# Patient Record
Sex: Female | Born: 1995 | Race: White | Hispanic: No | Marital: Single | Smoking: Never smoker
Health system: Southern US, Community
[De-identification: ages and names within clinical notes are randomized; demographics above are authoritative.]

## PROBLEM LIST (undated history)

## (undated) DIAGNOSIS — E282 Polycystic ovarian syndrome: Secondary | ICD-10-CM

## (undated) DIAGNOSIS — R091 Pleurisy: Secondary | ICD-10-CM

## (undated) HISTORY — PX: TONSILLECTOMY AND ADENOIDECTOMY: SHX28

## (undated) HISTORY — DX: Pleurisy: R09.1

## (undated) HISTORY — DX: Polycystic ovarian syndrome: E28.2

---

## 2016-06-05 ENCOUNTER — Ambulatory Visit (INDEPENDENT_AMBULATORY_CARE_PROVIDER_SITE_OTHER): Payer: Managed Care, Other (non HMO) | Admitting: Family Medicine

## 2016-06-05 ENCOUNTER — Encounter: Payer: Self-pay | Admitting: Family Medicine

## 2016-06-05 VITALS — BP 106/56 | HR 89 | Temp 98.1°F | Ht 64.0 in | Wt 148.8 lb

## 2016-06-05 DIAGNOSIS — R21 Rash and other nonspecific skin eruption: Secondary | ICD-10-CM

## 2016-06-05 DIAGNOSIS — L83 Acanthosis nigricans: Secondary | ICD-10-CM

## 2016-06-05 MED ORDER — HYDROCORTISONE 2.5 % EX CREA: TOPICAL_CREAM | CUTANEOUS | 1 refills | Status: DC

## 2016-06-05 NOTE — Patient Instructions (Signed)
Call in 2-3 weeks if no improvement. Can discuss taking a piece of the skin and sending it to the lab vs sending you to a skin specialist.

## 2016-06-05 NOTE — Progress Notes (Signed)
Pre visit review using our clinic review tool, if applicable. No additional management support is needed unless otherwise documented below in the visit note. 

## 2016-06-05 NOTE — Progress Notes (Signed)
Chief Complaint  Patient presents with  . Establish Care    pt states she has discoloration behind (B) knee-noticed x 2 weeks,pt was tx'ed last week for UTI-finshed ATB on yest want to follow-up on this       New Patient Visit SUBJECTIVE: HPI: Christie Soto is an 21 y.o.female who is being seen for establishing care.  The patient has had darkening behind both of her knees over the past 2 weeks. She does have a history of PCOS and is concerned it may be related to a acanthosis nigricans. She did go to an urgent care for abdominal pain, and was treated for urinary tract infection. A fasting blood sugar was 95. The area is not spreading. It itches, no pain or drainage. No new soaps, lotions, topicals, or detergents. No fevers or sick contacts.  No Known Allergies  Past Medical History:  Diagnosis Date  . PCOS (polycystic ovarian syndrome)   . Pleurisy    Past Surgical History:  Procedure Laterality Date  . TONSILLECTOMY AND ADENOIDECTOMY     Social History   Social History  . Marital status: Single   Social History Main Topics  . Smoking status: Never Smoker  . Smokeless tobacco: Never Used  . Alcohol use No  . Drug use: No   Family History  Problem Relation Age of Onset  . Hyperlipidemia Mother   . Hypertension Mother   . Cancer Mother     Cervical  . Cancer Maternal Grandfather     Brien Mates   Takes no medications routinely.  No LMP recorded. Patient is not currently having periods (Reason: PCOS).  ROS Const: Denies fevers  Skin: As noted in HPI   OBJECTIVE: BP (!) 106/56 (BP Location: Left Arm, Patient Position: Sitting, Cuff Size: Normal)   Pulse 89   Temp 98.1 F (36.7 C) (Oral)   Ht  (1.626 m)   Wt 148 lb 12.8 oz (67.5 kg)   SpO2 97%   BMI 25.54 kg/m   Constitutional: -  VS reviewed -  Well developed, well nourished, appears stated age -  No apparent distress  Psychiatric: -  Oriented to person, place, and time -  Memory intact -  Affect and  mood normal -  Fluent conversation, good eye contact -  Judgment and insight age appropriate  Eye: -  Conjunctivae clear, no discharge -  Pupils symmetric, round, reactive to light  ENMT: -  Oral mucosa without lesions, tongue and uvula midline    Tonsils not enlarged, no erythema, no exudate, trachea midline    Pharynx moist, no lesions, no erythema  Neck: -  No gross swelling, no palpable masses -  Thyroid midline, not enlarged, mobile, no palpable masses  Cardiovascular: -  RRR, no murmurs -  No LE edema  Respiratory: -  Normal respiratory effort, no accessory muscle use, no retraction -  Breath sounds equal, no wheezes, no ronchi, no crackles  Neurological:  -  CN II - XII grossly intact -  Sensation grossly intact to light touch, equal bilaterally  Musculoskeletal: -  No clubbing, no cyanosis -  Gait normal  Skin: -  Circumferential region of hyperpigmentation around the neck. No scaling, erythema, fluctuance, or tenderness to palpation. -  In the popliteal region bilaterally, there are black colored lesions that is not tender to palpation and without fluctuance, erythema; it is uniform in color, on the right side, there are areas of excoriation    ASSESSMENT/PLAN: Skin rash - Plan: hydrocortisone  2.5 % cream  Acanthosis nigricans - Plan: Hemoglobin A1c  Patient instructed to sign release of records form from her previous PCP. Steroid cream for pruritis. Will check A1c. If WNL and area persists, will let us know and we will either biopsy it vs refer to derm. Patient should return pending above. The patient voiced understanding and agreement to the plan.   Jilda Roche [place], DO 06/05/16  1:10 PM

## 2017-06-10 ENCOUNTER — Encounter: Payer: Managed Care, Other (non HMO) | Admitting: Family Medicine

## 2017-11-06 ENCOUNTER — Ambulatory Visit: Payer: Managed Care, Other (non HMO) | Admitting: Family Medicine

## 2017-11-06 ENCOUNTER — Encounter: Payer: Self-pay | Admitting: Family Medicine

## 2017-11-06 VITALS — BP 110/70 | HR 79 | Temp 98.2°F | Ht 64.0 in | Wt 171.1 lb

## 2017-11-06 DIAGNOSIS — R599 Enlarged lymph nodes, unspecified: Secondary | ICD-10-CM

## 2017-11-06 DIAGNOSIS — Z114 Encounter for screening for human immunodeficiency virus [HIV]: Secondary | ICD-10-CM

## 2017-11-06 DIAGNOSIS — Z0000 Encounter for general adult medical examination without abnormal findings: Secondary | ICD-10-CM

## 2017-11-06 NOTE — Patient Instructions (Addendum)
2-3 days to get results of labs results back.  If labs are normal, will get an ultrasound.  Let us know if you need anything.

## 2017-11-06 NOTE — Progress Notes (Signed)
Pre visit review using our clinic review tool, if applicable. No additional management support is needed unless otherwise documented below in the visit note. 

## 2017-11-06 NOTE — Progress Notes (Signed)
Chief Complaint  Patient presents with  . Adenopathy    Subjective: Patient is a 22 y.o. female here for swollen gland on L.  3 mo duration, went to UC and given abx. Pain improved, never got smaller though. No inj or illness. No URI s/s's. Gets bigger when she eats. No B cell s/s's. No lymph node enlargement in groin, neck or   ROS: Const: no fevers  Past Medical History:  Diagnosis Date  . PCOS (polycystic ovarian syndrome)   . Pleurisy     Objective: BP 110/70 (BP Location: Left Arm, Patient Position: Sitting, Cuff Size: Normal)   Pulse 79   Temp 98.2 F (36.8 C) (Oral)   Ht 5\' 4"  (1.626 m)   Wt 171 lb 2 oz (77.6 kg)   SpO2 99%   BMI 29.37 kg/m  General: Awake, appears stated age HEENT: MMM, EOMi, ears neg, nose neg Neck: Slightly enlarged submand gland on L , no ttp/erythema/warmth, fluctuance Heart: RRR Lungs: CTAB, no rales, wheezes or rhonchi. No accessory muscle use Psych: Age appropriate judgment and insight, normal affect and mood  Assessment and Plan: Enlarged glands  Well adult exam - Plan: CBC w/Diff, Lipid panel, Comprehensive metabolic panel  Screening for HIV (human immunodeficiency virus) - Plan: HIV Antibody (routine testing w rflx)  Orders as above.  Ck labs. If neg, will obtain us. Could be stone? CPE at earliest convenience. The patient voiced understanding and agreement to the plan.  Jilda Rocheicholas [place], DO 11/06/17  4:06 PM

## 2017-11-07 LAB — CBC WITH DIFFERENTIAL/PLATELET
BASOS: 1 %
Basophils Absolute: 0.1 10*3/uL (ref 0.0–0.2)
EOS (ABSOLUTE): 0.1 10*3/uL (ref 0.0–0.4)
EOS: 2 %
HEMATOCRIT: 38.5 % (ref 34.0–46.6)
HEMOGLOBIN: 13.2 g/dL (ref 11.1–15.9)
Immature Grans (Abs): 0 10*3/uL (ref 0.0–0.1)
Immature Granulocytes: 0 %
LYMPHS ABS: 1.6 10*3/uL (ref 0.7–3.1)
Lymphs: 20 %
MCH: 31.4 pg (ref 26.6–33.0)
MCHC: 34.3 g/dL (ref 31.5–35.7)
MCV: 91 fL (ref 79–97)
MONOCYTES: 7 %
Monocytes Absolute: 0.6 10*3/uL (ref 0.1–0.9)
NEUTROS ABS: 5.8 10*3/uL (ref 1.4–7.0)
Neutrophils: 70 %
Platelets: 376 10*3/uL (ref 150–450)
RBC: 4.21 x10E6/uL (ref 3.77–5.28)
RDW: 12 % — ABNORMAL LOW (ref 12.3–15.4)
WBC: 8.3 10*3/uL (ref 3.4–10.8)

## 2017-11-07 LAB — LIPID PANEL
Chol/HDL Ratio: 3 ratio (ref 0.0–4.4)
Cholesterol, Total: 140 mg/dL (ref 100–199)
HDL: 47 mg/dL (ref >39)
LDL CALC: 73 mg/dL (ref 0–99)
TRIGLYCERIDES: 98 mg/dL (ref 0–149)
VLDL CHOLESTEROL CAL: 20 mg/dL (ref 5–40)

## 2017-11-07 LAB — COMPREHENSIVE METABOLIC PANEL
A/G RATIO: 2.1 (ref 1.2–2.2)
ALK PHOS: 53 IU/L (ref 39–117)
ALT: 11 IU/L (ref 0–32)
AST: 16 IU/L (ref 0–40)
Albumin: 4.8 g/dL (ref 3.5–5.5)
BUN / CREAT RATIO: 15 (ref 9–23)
BUN: 12 mg/dL (ref 6–20)
Bilirubin Total: 0.5 mg/dL (ref 0.0–1.2)
CO2: 24 mmol/L (ref 20–29)
Calcium: 9.7 mg/dL (ref 8.7–10.2)
Chloride: 100 mmol/L (ref 96–106)
Creatinine, Ser: 0.81 mg/dL (ref 0.57–1.00)
GFR calc non Af Amer: 105 mL/min/{1.73_m2} (ref >59)
GFR, EST AFRICAN AMERICAN: 121 mL/min/{1.73_m2} (ref >59)
GLOBULIN, TOTAL: 2.3 g/dL (ref 1.5–4.5)
Glucose: 83 mg/dL (ref 65–99)
Potassium: 4.3 mmol/L (ref 3.5–5.2)
Sodium: 141 mmol/L (ref 134–144)
Total Protein: 7.1 g/dL (ref 6.0–8.5)

## 2017-11-07 LAB — HIV ANTIBODY (ROUTINE TESTING W REFLEX): HIV Screen 4th Generation wRfx: NONREACTIVE

## 2017-11-09 ENCOUNTER — Other Ambulatory Visit: Payer: Self-pay | Admitting: Family Medicine

## 2017-11-09 DIAGNOSIS — R599 Enlarged lymph nodes, unspecified: Secondary | ICD-10-CM

## 2017-11-13 ENCOUNTER — Ambulatory Visit (HOSPITAL_BASED_OUTPATIENT_CLINIC_OR_DEPARTMENT_OTHER): Payer: Managed Care, Other (non HMO)

## 2017-11-13 ENCOUNTER — Ambulatory Visit (INDEPENDENT_AMBULATORY_CARE_PROVIDER_SITE_OTHER): Payer: Managed Care, Other (non HMO)

## 2017-11-13 DIAGNOSIS — R590 Localized enlarged lymph nodes: Secondary | ICD-10-CM

## 2017-11-13 DIAGNOSIS — R599 Enlarged lymph nodes, unspecified: Secondary | ICD-10-CM

## 2017-11-17 ENCOUNTER — Telehealth: Payer: Self-pay

## 2017-11-17 DIAGNOSIS — R599 Enlarged lymph nodes, unspecified: Secondary | ICD-10-CM

## 2017-11-17 NOTE — Addendum Note (Signed)
Addended byConrad Amazonia: Lura Falor D on: 11/17/2017 05:06 PM   Modules accepted: Orders

## 2017-11-17 NOTE — Telephone Encounter (Signed)
Referral placed.

## 2017-11-17 NOTE — Telephone Encounter (Signed)
Copied from CRM [phone]#[id number]. Topic: Referral - Request >> Nov 17, 2017  1:19 PM Terisa Starraylor, Brittany L wrote: Reason for CRM: Patient said she received a mychart message for her to be referred to a ENT. She would like to move forward with that. She would like a call back. [phone]

## 2017-11-17 NOTE — Telephone Encounter (Signed)
OK to place referral. TY.  

## 2017-12-09 ENCOUNTER — Encounter: Payer: Managed Care, Other (non HMO) | Admitting: Family Medicine

## 2017-12-24 ENCOUNTER — Encounter: Payer: Self-pay | Admitting: Family Medicine

## 2018-12-17 ENCOUNTER — Other Ambulatory Visit: Payer: Self-pay

## 2018-12-17 ENCOUNTER — Emergency Department
Admission: EM | Admit: 2018-12-17 | Discharge: 2018-12-17 | Disposition: A | Discharge status: Home / Self Care | Payer: Managed Care, Other (non HMO) | Source: Home / Self Care | Attending: Family Medicine | Admitting: Family Medicine

## 2018-12-17 DIAGNOSIS — N3090 Cystitis, unspecified without hematuria: Secondary | ICD-10-CM

## 2018-12-17 DIAGNOSIS — R300 Dysuria: Secondary | ICD-10-CM

## 2018-12-17 LAB — POCT URINALYSIS DIP (MANUAL ENTRY)
Bilirubin, UA: NEGATIVE
Glucose, UA: NEGATIVE mg/dL
Ketones, POC UA: NEGATIVE mg/dL
Nitrite, UA: POSITIVE — AB
Protein Ur, POC: NEGATIVE mg/dL
Spec Grav, UA: 1.025 (ref 1.010–1.025)
Urobilinogen, UA: 0.2 E.U./dL
pH, UA: 6.5 (ref 5.0–8.0)

## 2018-12-17 MED ORDER — NITROFURANTOIN MONOHYD MACRO 100 MG PO CAPS: 100.0000 mg | ORAL_CAPSULE | Freq: Two times a day (BID) | ORAL | 0 refills | Status: DC

## 2018-12-17 NOTE — Discharge Instructions (Signed)
Increase fluid intake. May use non-prescription AZO for about two days, if desired, to decrease urinary discomfort.  If symptoms become significantly worse during the night or over the weekend, proceed to the local emergency room.  

## 2018-12-17 NOTE — ED Provider Notes (Signed)
[facility]    CSN: [id number] Arrival date & time: 12/17/18  1103      History   Chief Complaint Chief Complaint  Patient presents with  . Dysuria    HPI Christie Soto is a 23 y.o. female.   Patient awoke two days ago with dysuria and urgency that has persisted.  She denies abdominal/pelvic pain and fevers, chills, and sweats.  Patient's last menstrual period was 10/30/2018 (exact date).   The history is provided by the patient.  Dysuria Pain quality:  Burning Pain severity:  Mild Onset quality:  Sudden Duration:  2 days Timing:  Constant Progression:  Worsening Chronicity:  New Relieved by:  Phenazopyridine Worsened by:  Nothing Ineffective treatments:  None tried Urinary symptoms: frequent urination and hesitancy   Urinary symptoms: no discolored urine, no foul-smelling urine, no hematuria and no bladder incontinence   Associated symptoms: no abdominal pain, no fever, no flank pain, no genital lesions, no nausea and no vomiting   Risk factors: no recurrent urinary tract infections     Past Medical History:  Diagnosis Date  . PCOS (polycystic ovarian syndrome)   . Pleurisy     There are no active problems to display for this patient.   Past Surgical History:  Procedure Laterality Date  . TONSILLECTOMY AND ADENOIDECTOMY      OB History   No obstetric history on file.      Home Medications    Prior to Admission medications   Medication Sig Start Date End Date Taking? Authorizing Provider  drospirenone-ethinyl estradiol (YASMIN) 3-0.03 MG tablet Take by mouth. 08/15/16  Yes [provider]  nitrofurantoin, macrocrystal-monohydrate, (MACROBID) 100 MG capsule Take 1 capsule (100 mg total) by mouth 2 (two) times daily. Take with food. 12/17/18   Lattie Haw, MD    Family History Family History  Problem Relation Age of Onset  . Hyperlipidemia Mother   . Hypertension Mother   . Cancer Mother        Cervical  . Cancer  Maternal Grandfather        Lekemia    Social History Social History   Tobacco Use  . Smoking status: Never Smoker  . Smokeless tobacco: Never Used  Substance Use Topics  . Alcohol use: No  . Drug use: No     Allergies   Patient has no known allergies.   Review of Systems Review of Systems  Constitutional: Negative for fever.  Gastrointestinal: Negative for abdominal pain, nausea and vomiting.  Genitourinary: Positive for dysuria and urgency. Negative for flank pain, hematuria and pelvic pain.  All other systems reviewed and are negative.    Physical Exam Triage Vital Signs ED Triage Vitals  Enc Vitals Group     BP 12/17/18 1116 (!) 150/88     Pulse Rate 12/17/18 1116 76     Resp 12/17/18 1116 18     Temp 12/17/18 1116 98.6 F (37 C)     Temp Source 12/17/18 1116 Oral     SpO2 12/17/18 1116 100 %     Weight 12/17/18 1117 165 lb (74.8 kg)     Height 12/17/18 1117 5\' 4"  (1.626 m)     Head Circumference --      Peak Flow --      Pain Score 12/17/18 1117 2     Pain Loc --      Pain Edu? --      Excl. in GC? --    No data found.  Updated Vital Signs BP (!) 150/88 (BP Location: Right Arm)   Pulse 76   Temp 98.6 F (37 C) (Oral)   Resp 18   Ht 5\' 4"  (1.626 m)   Wt 74.8 kg   LMP 10/30/2018 (Exact Date)   SpO2 100%   BMI 28.32 kg/m   Visual Acuity Right Eye Distance:   Left Eye Distance:   Bilateral Distance:    Right Eye Near:   Left Eye Near:    Bilateral Near:     Physical Exam Nursing notes and Vital Signs reviewed. Appearance:  Patient appears stated age, and in no acute distress.    Eyes:  Pupils are equal, round, and reactive to light and accomodation.  Extraocular movement is intact.  Conjunctivae are not inflamed   Pharynx:  Normal; moist mucous membranes  Neck:  Supple.  No adenopathy Lungs:  Clear to auscultation.  Breath sounds are equal.  Moving air well. Heart:  Regular rate and rhythm without murmurs, rubs, or gallops.  Abdomen:   Nontender without masses or hepatosplenomegaly.  Bowel sounds are present.  No CVA or flank tenderness.  Extremities:  No edema.  Skin:  No rash present.     UC Treatments / Results  Labs (all labs ordered are listed, but only abnormal results are displayed) Labs Reviewed  POCT URINALYSIS DIP (MANUAL ENTRY) - Abnormal; Notable for the following components:      Result Value   Clarity, UA cloudy (*)    Blood, UA moderate (*)    Nitrite, UA Positive (*)    Leukocytes, UA Moderate (2+) (*)    All other components within normal limits  URINE CULTURE    EKG   Radiology No results found.  Procedures Procedures (including critical care time)  Medications Ordered in UC Medications - No data to display  Initial Impression / Assessment and Plan / UC Course  I have reviewed the triage vital signs and the nursing notes.  Pertinent labs & imaging results that were available during my care of the patient were reviewed by me and considered in my medical decision making (see chart for details).    Urine culture pending. Begin Macrobid 100mg  BID for one week.  .  Followup with Family Doctor if not improved in one week.    Final Clinical Impressions(s) / UC Diagnoses   Final diagnoses:  Dysuria  Cystitis     Discharge Instructions     Increase fluid intake. May use non-prescription AZO for about two days, if desired, to decrease urinary discomfort.  If symptoms become significantly worse during the night or over the weekend, proceed to the local emergency room.     ED Prescriptions    Medication Sig Dispense Auth. Provider   nitrofurantoin, macrocrystal-monohydrate, (MACROBID) 100 MG capsule Take 1 capsule (100 mg total) by mouth 2 (two) times daily. Take with food. 14 capsule Kandra Nicolas, MD        Kandra Nicolas, MD 12/17/18 [phone]

## 2018-12-17 NOTE — ED Triage Notes (Signed)
Pt c/o dysuria x 2 days. Taking azo prn. Last dose was yesterday morning.

## 2018-12-19 LAB — URINE CULTURE
MICRO NUMBER:: 1023733
SPECIMEN QUALITY:: ADEQUATE

## 2018-12-20 ENCOUNTER — Telehealth (HOSPITAL_COMMUNITY): Payer: Self-pay | Admitting: Emergency Medicine

## 2018-12-20 NOTE — Telephone Encounter (Signed)
Urine culture was positive for Escherichia coli and was given macrobid  at urgent care visit. Attempted to reach patient. No answer at this time.   

## 2018-12-30 ENCOUNTER — Other Ambulatory Visit: Payer: Self-pay

## 2018-12-30 DIAGNOSIS — Z20822 Contact with and (suspected) exposure to covid-19: Secondary | ICD-10-CM

## 2018-12-31 LAB — NOVEL CORONAVIRUS, NAA: SARS-CoV-2, NAA: NOT DETECTED

## 2019-03-01 ENCOUNTER — Ambulatory Visit: Payer: Managed Care, Other (non HMO) | Admitting: Family Medicine

## 2019-03-13 IMAGING — US US SOFT TISSUE HEAD/NECK
1 series · 13 of 13 positions shown · non-contrast
Comparison: None.

CLINICAL DATA: Enlarged lymph nodes, neck swelling

EXAM:
ULTRASOUND OF HEAD/NECK SOFT TISSUES
TECHNIQUE: Ultrasound examination of the head and neck soft tissues was
performed in the area of clinical concern.

[Series 1: us soft tissue head/neck · 0.04mm/px · 13 of 13 slices shown]
[im 1/13]
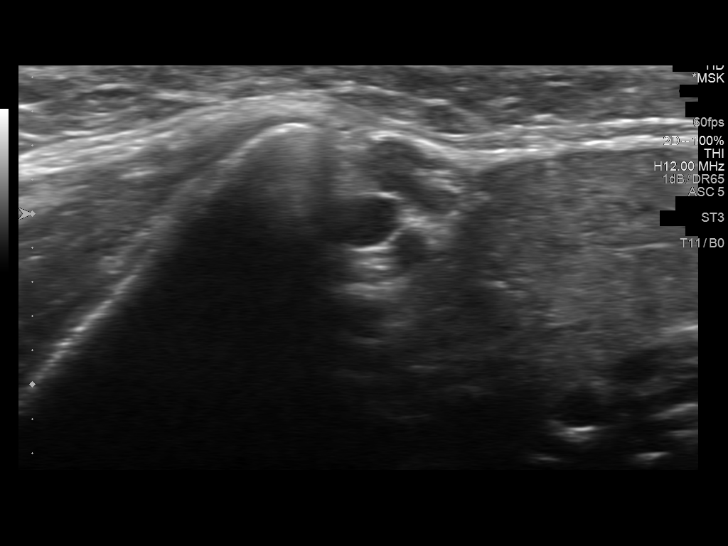
[im 2/13]
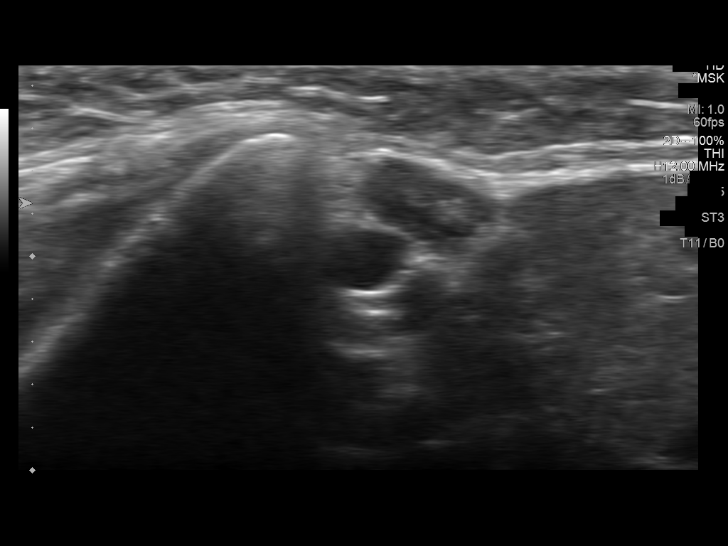
[im 3/13]
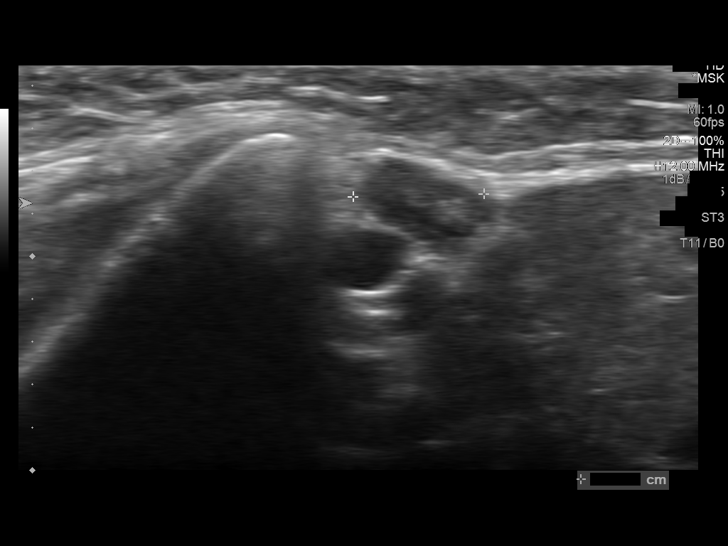
[im 4/13]
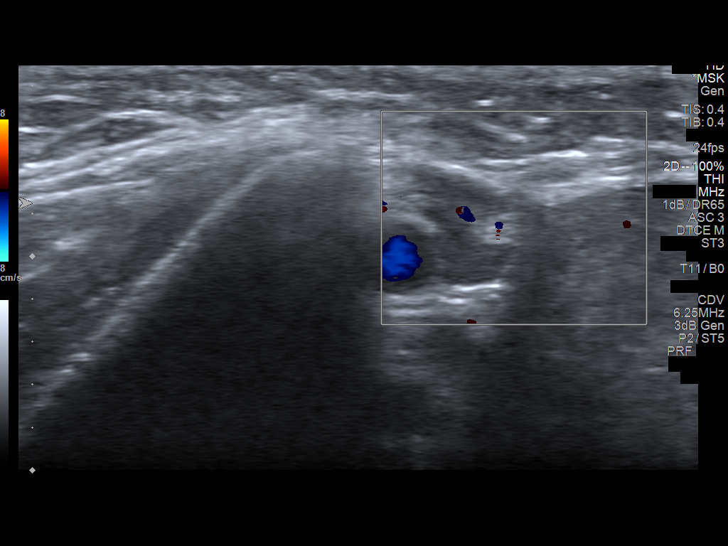
[im 5/13]
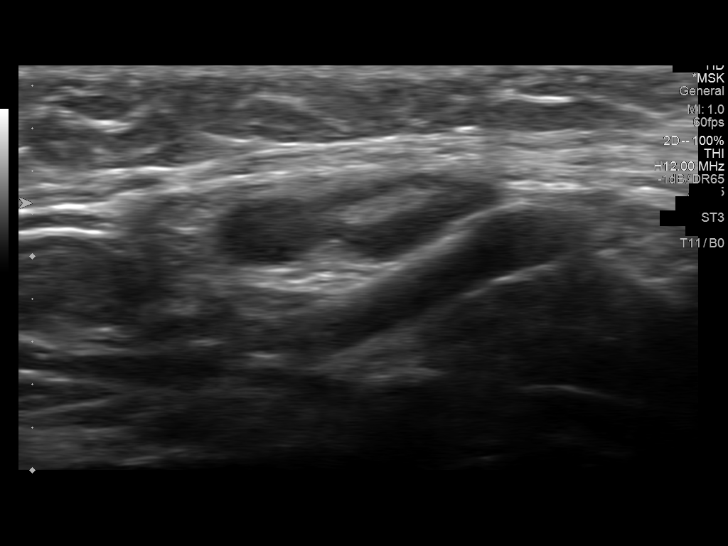
[im 6/13]
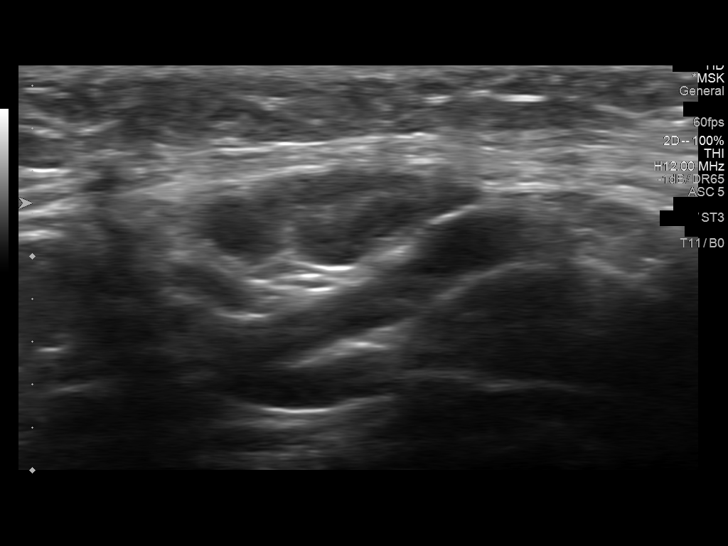
[im 7/13]
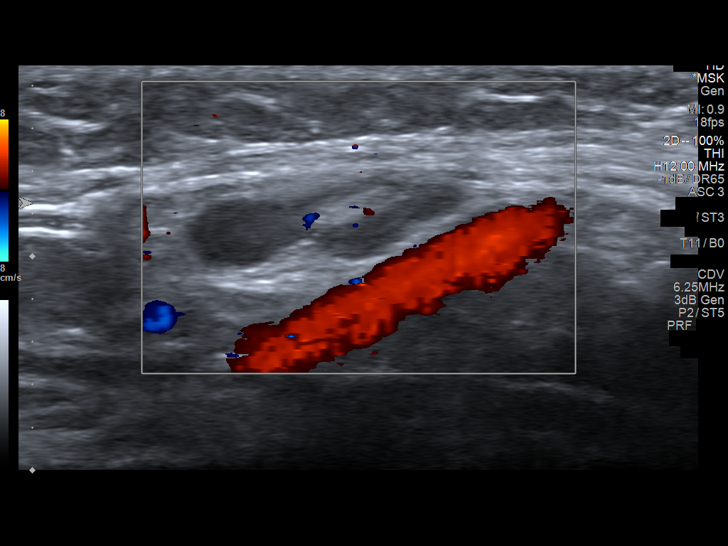
[im 8/13]
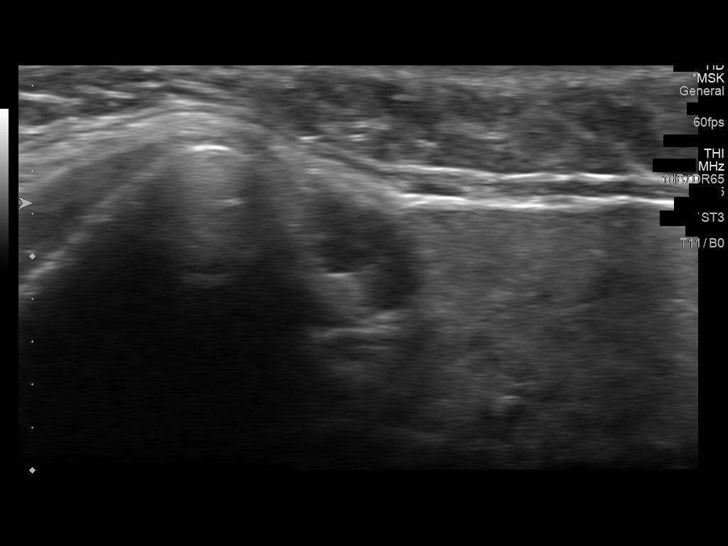
[im 9/13]
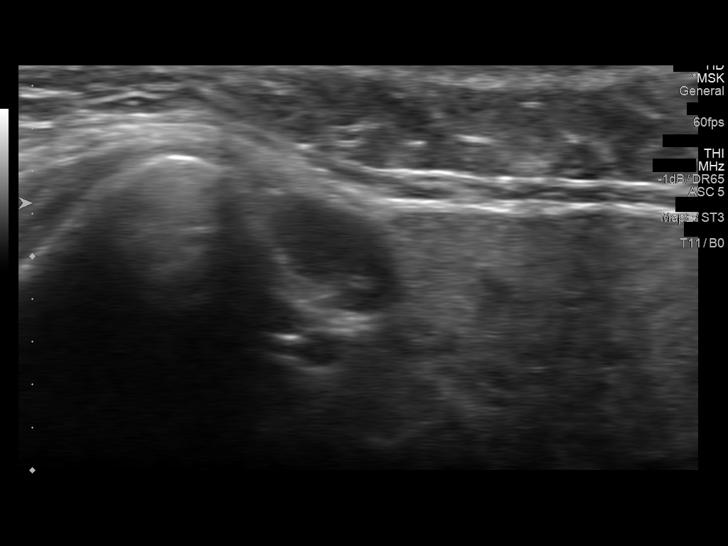
[im 10/13]
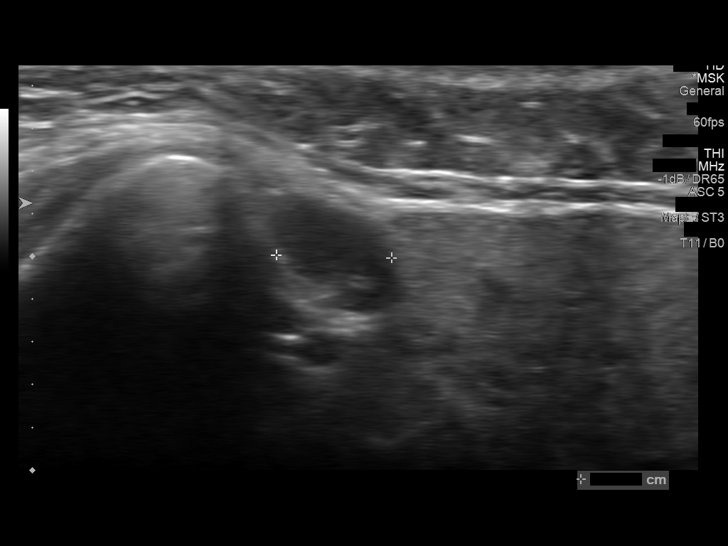
[im 11/13]
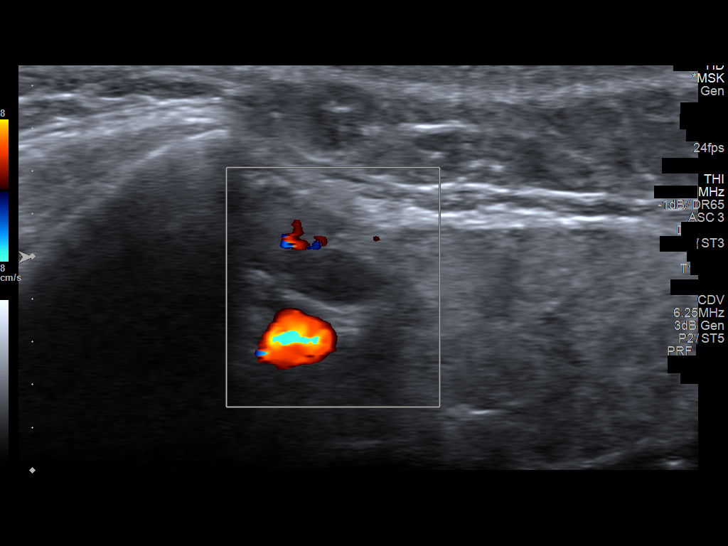
[im 12/13]
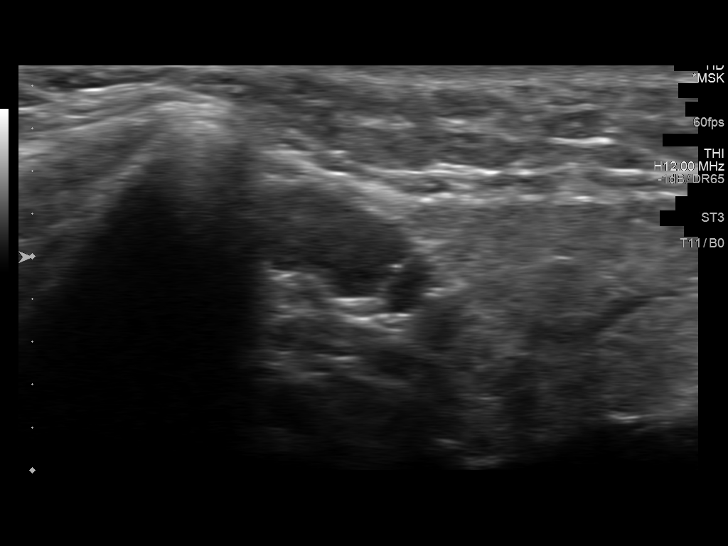
[im 13/13]
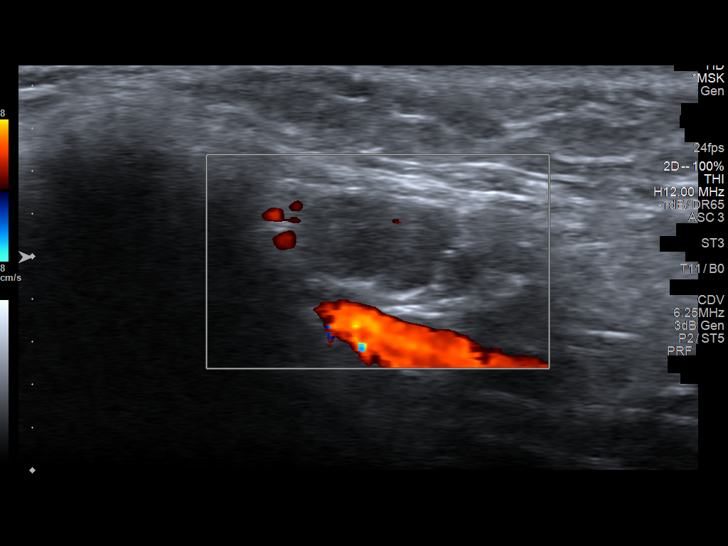

[13 of 13 positions shown; findings below may reference images not displayed]

FINDINGS: Limited superficial soft tissue ultrasound performed of the
submandibular areas of concern. Comparison performed by imaging the
right side as well.

On the left side, the area of concern correlates with a small
superficial submandibular lymph nodes with fatty hila and preserved
hypoechoic cortex only measuring 6 mm. Similar lymph nodes noted
along the right submandibular gland. No other significant soft
tissue abnormality, cyst, fluid collection, abscess, or hematoma.
Image portion of the submandibular glands appear unremarkable.
IMPRESSION: Submandibular region palpable abnormalities correlate with
benign-appearing submandibular lymph nodes.

## 2020-02-07 ENCOUNTER — Emergency Department
Admission: RE | Admit: 2020-02-07 | Discharge: 2020-02-07 | Disposition: A | Discharge status: Home / Self Care | Payer: Managed Care, Other (non HMO) | Source: Ambulatory Visit | Attending: Internal Medicine | Admitting: Internal Medicine

## 2020-02-07 ENCOUNTER — Other Ambulatory Visit: Payer: Self-pay

## 2020-02-07 VITALS — BP 128/89 | HR 108 | Temp 98.3°F | Resp 18 | Ht 64.0 in | Wt 185.0 lb

## 2020-02-07 DIAGNOSIS — H9201 Otalgia, right ear: Secondary | ICD-10-CM

## 2020-02-07 MED ORDER — ACETIC ACID 2 % OT SOLN: 4.0000 [drp] | Freq: Three times a day (TID) | OTIC | 0 refills | Status: AC

## 2020-02-07 NOTE — ED Triage Notes (Signed)
Right ear pain x 5 days Not vaccinated

## 2020-02-07 NOTE — ED Provider Notes (Signed)
[facility]    CSN: [id number] Arrival date & time: 02/07/20  1309      History   Chief Complaint Chief Complaint  Patient presents with   Otalgia    HPI Dyllan Kats is a 24 y.o. female comes to the urgent care with right ear pain of 5 days duration.  Patient recently traveled by air to [place] and back.  Upon return patient complained of pain in the right ear.  No discharge.  No ringing in the ear.  Pain is of moderate severity.  No hearing difficulty.   Patient cleans the ears daily with a Q-tip.  HPI  Past Medical History:  Diagnosis Date   PCOS (polycystic ovarian syndrome)    Pleurisy     There are no problems to display for this patient.   Past Surgical History:  Procedure Laterality Date   TONSILLECTOMY AND ADENOIDECTOMY      OB History   No obstetric history on file.      Home Medications    Prior to Admission medications   Medication Sig Start Date End Date Taking? Authorizing Provider  acetic acid 2 % otic solution Place 4 drops into the right ear 3 (three) times daily for 5 days. 02/07/20 02/12/20  Merrilee Jansky, MD  progesterone (ENDOMETRIN) 100 MG vaginal insert Place 100 mg vaginally 2 (two) times daily.    [provider]    Family History Family History  Problem Relation Age of Onset   Hyperlipidemia Mother    Hypertension Mother    Cancer Mother        Cervical   Cancer Maternal Grandfather        Lekemia    Social History Social History   Tobacco Use   Smoking status: Never Smoker   Smokeless tobacco: Never Used  Building services engineer Use: Never used  Substance Use Topics   Alcohol use: No   Drug use: No     Allergies   Patient has no known allergies.   Review of Systems Review of Systems  HENT: Positive for ear pain. Negative for ear discharge, hearing loss, sore throat and tinnitus.   Respiratory: Negative.   Cardiovascular: Negative.      Physical Exam Triage Vital  Signs ED Triage Vitals  Enc Vitals Group     BP 02/07/20 1338 128/89     Pulse Rate 02/07/20 1338 (!) 108     Resp 02/07/20 1338 18     Temp 02/07/20 1338 98.3 F (36.8 C)     Temp Source 02/07/20 1338 Oral     SpO2 02/07/20 1338 96 %     Weight 02/07/20 1339 185 lb (83.9 kg)     Height 02/07/20 1339 5\' 4"  (1.626 m)     Head Circumference --      Peak Flow --      Pain Score 02/07/20 1339 2     Pain Loc --      Pain Edu? --      Excl. in GC? --    No data found.  Updated Vital Signs BP 128/89 (BP Location: Right Arm)    Pulse (!) 108    Temp 98.3 F (36.8 C) (Oral)    Resp 18    Ht 5\' 4"  (1.626 m)    Wt 83.9 kg    LMP 02/05/2020 (Exact Date)    SpO2 96%    BMI 31.76 kg/m   Visual Acuity Right Eye Distance:  Left Eye Distance:   Bilateral Distance:    Right Eye Near:   Left Eye Near:    Bilateral Near:     Physical Exam Vitals and nursing note reviewed.  Constitutional:      General: She is not in acute distress.    Appearance: She is not ill-appearing.  HENT:     Right Ear: Tympanic membrane normal.     Left Ear: Tympanic membrane normal.     Ears:     Comments: Mild erythema of the external ear canal.    Mouth/Throat:     Mouth: Mucous membranes are moist.     Pharynx: No posterior oropharyngeal erythema.  Cardiovascular:     Rate and Rhythm: Normal rate and regular rhythm.  Neurological:     Mental Status: She is alert.      UC Treatments / Results  Labs (all labs ordered are listed, but only abnormal results are displayed) Labs Reviewed - No data to display  EKG   Radiology No results found.  Procedures Procedures (including critical care time)  Medications Ordered in UC Medications - No data to display  Initial Impression / Assessment and Plan / UC Course  I have reviewed the triage vital signs and the nursing notes.  Pertinent labs & imaging results that were available during my care of the patient were reviewed by me and considered in  my medical decision making (see chart for details).    1.  Otalgia of the right ear likely mild otitis externa: Acetic acid eardrops Tylenol as needed for pain Return precautions given. Final Clinical Impressions(s) / UC Diagnoses   Final diagnoses:  Otalgia of right ear   Discharge Instructions   None    ED Prescriptions    Medication Sig Dispense Auth. Provider   acetic acid 2 % otic solution Place 4 drops into the right ear 3 (three) times daily for 5 days. 15 mL Janayah Zavada, Britta Mccreedy, MD     PDMP not reviewed this encounter.   Merrilee Jansky, MD 02/07/20 1504
